# Patient Record
Sex: Male | Born: 1975 | Race: White | Hispanic: No | Marital: Married | Smoking: Never smoker
Health system: Southern US, Community
[De-identification: ages and names within clinical notes are randomized; demographics above are authoritative.]

## PROBLEM LIST (undated history)

## (undated) DIAGNOSIS — Z87442 Personal history of urinary calculi: Secondary | ICD-10-CM

---

## 1998-09-10 HISTORY — PX: KNEE ARTHROSCOPY W/ ACL RECONSTRUCTION: SHX1858

## 2011-10-01 ENCOUNTER — Emergency Department: Payer: Self-pay | Admitting: Emergency Medicine

## 2020-01-08 ENCOUNTER — Other Ambulatory Visit (HOSPITAL_COMMUNITY)
Admission: RE | Admit: 2020-01-08 | Discharge: 2020-01-08 | Disposition: A | Discharge status: Home / Self Care | Payer: BC Managed Care – PPO | Source: Ambulatory Visit | Attending: Urology | Admitting: Urology

## 2020-01-08 ENCOUNTER — Other Ambulatory Visit: Payer: Self-pay | Admitting: Urology

## 2020-01-08 ENCOUNTER — Encounter (HOSPITAL_BASED_OUTPATIENT_CLINIC_OR_DEPARTMENT_OTHER): Payer: Self-pay | Admitting: Urology

## 2020-01-08 DIAGNOSIS — Z20822 Contact with and (suspected) exposure to covid-19: Secondary | ICD-10-CM | POA: Insufficient documentation

## 2020-01-08 DIAGNOSIS — Z01812 Encounter for preprocedural laboratory examination: Secondary | ICD-10-CM | POA: Insufficient documentation

## 2020-01-08 LAB — SARS CORONAVIRUS 2 (TAT 6-24 HRS): SARS Coronavirus 2: NEGATIVE

## 2020-01-08 MED FILL — traMADol HCL 50 MG TABS: 50 | 2 days supply | Qty: 10 | Fill #0

## 2020-01-08 NOTE — H&P (Signed)
Office Visit Report     01/08/2020   --------------------------------------------------------------------------------   Alejandro Wright  MRN: [id number]  DOB: Jan 08, 1976, 44 year old Male  SSN:    PRIMARY CARE:    REFERRING:    PROVIDER:  Jerilee Field, M.D.  TREATING:  Berniece Salines, M.D.  LOCATION:  [facility] [phone]     --------------------------------------------------------------------------------   CC: Acute Kidney Stone  HPI: Alejandro Wright is a 44 year-old male patient who is here for further eval and management of kidney stones.  The patient presented to [facility] with symptoms of a kidney stone.   His pain started about 01/02/2020. The pain is on the right side.   The patient underwent KUB prior to today's appointment.   The patient relates initially having nausea, flank pain, voiding symptoms, and groin pain. He is currently having flank pain. He denies having back pain, groin pain, nausea, vomiting, fever, chills, and voiding symptoms. He has not caught a stone in his urine strainer since his symptoms began.   He has had ESWL for treatment of his stones in the past. This is not his first kidney stone. His first stone was approximately 12/10/2014. He has had more than 5 stones prior to getting this one.   Patient for started passed in stones as a teenager. He states that he passes 6 stones a year. He has E typically able to pass these on his own. He has passed very large stones in the past. He has had shockwave lithotripsy also for stones that are too big to pass.   The patient generally does not require any pain medication. He recently took Voltaren because of severe discomfort. He was seen by of primary care physician in [place] and told he had a very large stone.     ALLERGIES: None   MEDICATIONS: Cipro  Ultram 50 mg tablet 1-2 tablet PO Q 6 H  Advil  Diclofenac     GU PSH: Cysto Uretero Lithotripsy - about  2016     NON-GU PSH: None   GU PMH: None   NON-GU PMH: None   FAMILY HISTORY: Kidney Stones - Runs in Family   SOCIAL HISTORY: Marital Status: Married Preferred Language: English Current Smoking Status: Patient has never smoked.   Tobacco Use Assessment Completed: Used Tobacco in last 30 days? Does drink.  Drinks 1 caffeinated drink per day. 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    REVIEW OF SYSTEMS:    GU Review Male:   Patient reports get up at night to urinate. Patient denies frequent urination, hard to postpone urination, burning/ pain with urination, leakage of urine, stream starts and stops, trouble starting your stream, have to strain to urinate , erection problems, and penile pain.  Gastrointestinal (Upper):   Patient reports nausea. Patient denies vomiting and indigestion/ heartburn.  Gastrointestinal (Lower):   Patient denies diarrhea and constipation.  Constitutional:   Patient denies fever, night sweats, weight loss, and fatigue.  Skin:   Patient denies skin rash/ lesion and itching.  Eyes:   Patient denies blurred vision and double vision.  Ears/ Nose/ Throat:   Patient denies sore throat and sinus problems.  Hematologic/Lymphatic:   Patient denies swollen glands and easy bruising.  Cardiovascular:   Patient denies leg swelling and chest pains.  Respiratory:   Patient denies cough and shortness of breath.  Endocrine:   Patient denies excessive thirst.  Musculoskeletal:   Patient reports back pain. Patient denies joint pain.  Neurological:   Patient denies headaches and dizziness.  Psychologic:   Patient denies depression and anxiety.   Notes: Pt c/o blood in urine    VITAL SIGNS:      01/08/2020 01:35 PM  Weight 150 lb / 68.04 kg  Height 68 in / 172.72 cm  BP 120/74 mmHg  Heart Rate 56 /min  Temperature 98.0 F / 36.6 C  BMI 22.8 kg/m   MULTI-SYSTEM PHYSICAL EXAMINATION:    Constitutional: Well-nourished. No physical deformities. Normally developed.  Good grooming.  Neck: Neck symmetrical, not swollen. Normal tracheal position.  Respiratory: Normal breath sounds. No labored breathing, no use of accessory muscles.   Cardiovascular: Regular rate and rhythm. No murmur, no gallop. Normal temperature, normal extremity pulses, no swelling, no varicosities.   Lymphatic: No enlargement of neck, axillae, groin.  Skin: No paleness, no jaundice, no cyanosis. No lesion, no ulcer, no rash.  Neurologic / Psychiatric: Oriented to time, oriented to place, oriented to person. No depression, no anxiety, no agitation.  Gastrointestinal: No mass, no tenderness, no rigidity, non obese abdomen.  Eyes: Normal conjunctivae. Normal eyelids.  Ears, Nose, Mouth, and Throat: Left ear no scars, no lesions, no masses. Right ear no scars, no lesions, no masses. Nose no scars, no lesions, no masses. Normal hearing. Normal lips.  Musculoskeletal: Normal gait and station of head and neck.     Complexity of Data:  Records Review:   Previous Patient Records, POC Tool  Urine Test Review:   Urinalysis   PROCEDURES:         KUB - 32355  A single view of the abdomen is obtained. Renal shadows are easily visualized bilaterally.  The patient appears to have approximately 5 stones in the right kidney and 6 stones in the left kidney. These are all Approximately 5-10 mm There are no stones appreciated within the expected location in either renal pelvis.  There is a 9 x 9 mm stone in the right proximal ureter There are no additional calcifications along the expected location of either ureter bilaterally.  Gas pattern is grossly normal. No significant bony abnormalities.      Impression: Bilateral nonobstructing stones with a 9 x 9 mm stone in the right mid proximal ureter.           Urinalysis w/Scope Micro  WBC/hpf: 0 - 5/hpf  RBC/hpf: 3 - 10/hpf  Bacteria: NS (Not Seen)  Cystals: NS (Not Seen)  Casts: NS (Not Seen)  Trichomonas: Not Present  Mucous: Not Present   Epithelial Cells: NS (Not Seen)  Yeast: NS (Not Seen)  Sperm: Not Present    ASSESSMENT:      ICD-10 Details  1 GU:   Renal and ureteral calculus - N20.2    PLAN:            Medications Refill Meds: Ultram 50 mg tablet 1-2 tablet PO Q 6 H   #10  0 Refill(s)            Orders X-Rays: KUB          Schedule         Document Letter(s):  Created for Patient: Clinical Summary         Notes:   The patient has history of extensive stone disease. He is typically able to pass the stones on his own. However, he has a 9 x 9 mm stone in the right proximal ureter easily visualized on today's x-ray that he has been able to pass, and  in my opinion likely will pass.   I recommended treatment for the obstructing stone, the patient is opted for shockwave lithotripsy.   We discussed management options including medical expulsion therapy, shockwave lithotripsy, and ureteroscopy. Ultimately, the patient has opted for shock wave lithotripsy. I discussed with the patient the procedure in detail as well as the risk and benefits. The patient is aware that she may need additional procedures. She also is aware of the risks of hematoma and pain. We will try to get this patient's scheduled as soon as possible.   I will send in some tramadol for the patient.         Next Appointment:      Next Appointment: 01/11/2020 10:00 AM    Appointment Type: Surgery     Location: [facility] [phone]    Provider: Festus Aloe, M.D.    Reason for Visit: OP NE RT ESWL      * Signed by Louis Meckel, M.D. on 01/08/20 at 3:17 PM (EDT)*     The information contained in this medical record document is considered private and confidential patient information. This information can only be used for the medical diagnosis and/or medical services that are being provided by the patient's selected caregivers. This information can only be distributed outside of the patient's care if the patient  agrees and signs waivers of authorization for this information to be sent to an outside source or route.

## 2020-01-11 ENCOUNTER — Encounter (HOSPITAL_BASED_OUTPATIENT_CLINIC_OR_DEPARTMENT_OTHER): Payer: Self-pay | Admitting: Urology

## 2020-01-11 ENCOUNTER — Encounter (HOSPITAL_BASED_OUTPATIENT_CLINIC_OR_DEPARTMENT_OTHER)
Admission: RE | Disposition: A | Discharge status: Home / Self Care | Payer: Self-pay | Source: Home / Self Care | Attending: Urology

## 2020-01-11 ENCOUNTER — Other Ambulatory Visit: Payer: Self-pay

## 2020-01-11 ENCOUNTER — Ambulatory Visit (HOSPITAL_BASED_OUTPATIENT_CLINIC_OR_DEPARTMENT_OTHER)
Admission: RE | Admit: 2020-01-11 | Discharge: 2020-01-11 | Disposition: A | Discharge status: Home / Self Care | Payer: BC Managed Care – PPO | Attending: Urology | Admitting: Urology

## 2020-01-11 ENCOUNTER — Ambulatory Visit (HOSPITAL_COMMUNITY): Payer: BC Managed Care – PPO

## 2020-01-11 DIAGNOSIS — N202 Calculus of kidney with calculus of ureter: Secondary | ICD-10-CM | POA: Diagnosis not present

## 2020-01-11 DIAGNOSIS — N200 Calculus of kidney: Secondary | ICD-10-CM | POA: Diagnosis present

## 2020-01-11 DIAGNOSIS — Z87442 Personal history of urinary calculi: Secondary | ICD-10-CM | POA: Diagnosis not present

## 2020-01-11 DIAGNOSIS — N201 Calculus of ureter: Secondary | ICD-10-CM

## 2020-01-11 HISTORY — DX: Personal history of urinary calculi: Z87.442

## 2020-01-11 HISTORY — PX: EXTRACORPOREAL SHOCK WAVE LITHOTRIPSY: SHX1557

## 2020-01-11 SURGERY — LITHOTRIPSY, ESWL
Anesthesia: LOCAL | Laterality: Right

## 2020-01-11 MED ORDER — DIAZEPAM 5 MG PO TABS
ORAL_TABLET | ORAL | Status: AC
  Filled 2020-01-11: qty 2

## 2020-01-11 MED ORDER — DIAZEPAM 5 MG PO TABS
10.0000 mg | ORAL_TABLET | ORAL | Status: AC
  Administered 2020-01-11: 10 mg via ORAL

## 2020-01-11 MED ORDER — CIPROFLOXACIN HCL 500 MG PO TABS
ORAL_TABLET | ORAL | Status: AC
  Filled 2020-01-11: qty 1

## 2020-01-11 MED ORDER — DIPHENHYDRAMINE HCL 25 MG PO CAPS
25.0000 mg | ORAL_CAPSULE | ORAL | Status: AC
  Administered 2020-01-11: 25 mg via ORAL

## 2020-01-11 MED ORDER — TRAMADOL HCL 50 MG PO TABS: 50.0000 mg | ORAL_TABLET | Freq: Four times a day (QID) | ORAL | 0 refills | Status: AC | PRN

## 2020-01-11 MED ORDER — SODIUM CHLORIDE 0.9 % IV SOLN: INTRAVENOUS | Status: DC

## 2020-01-11 MED ORDER — TRAMADOL HCL 50 MG PO TABS
ORAL_TABLET | ORAL | Status: AC
  Filled 2020-01-11: qty 1

## 2020-01-11 MED ORDER — CIPROFLOXACIN HCL 500 MG PO TABS
500.0000 mg | ORAL_TABLET | ORAL | Status: AC
  Administered 2020-01-11: 500 mg via ORAL

## 2020-01-11 MED ORDER — DIPHENHYDRAMINE HCL 25 MG PO CAPS
ORAL_CAPSULE | ORAL | Status: AC
  Filled 2020-01-11: qty 1

## 2020-01-11 MED ORDER — TRAMADOL HCL 50 MG PO TABS
50.0000 mg | ORAL_TABLET | Freq: Once | ORAL | Status: AC
  Administered 2020-01-11: 50 mg via ORAL

## 2020-01-11 MED FILL — traMADol HCL 50 MG TABS: 50 | 2 days supply | Qty: 10 | Fill #0

## 2020-01-11 NOTE — Discharge Instructions (Signed)
Post Anesthesia Home Care Instructions  Activity: Get plenty of rest for the remainder of the day. A responsible adult should stay with you for 24 hours following the procedure.  For the next 24 hours, DO NOT: -Drive a car -Operate machinery -Drink alcoholic beverages -Take any medication unless instructed by your physician -Make any legal decisions or sign important papers.  Meals: Start with liquid foods such as gelatin or soup. Progress to regular foods as tolerated. Avoid greasy, spicy, heavy foods. If nausea and/or vomiting occur, drink only clear liquids until the nausea and/or vomiting subsides. Call your physician if vomiting continues.  Special Instructions/Symptoms: Your throat may feel dry or sore from the anesthesia or the breathing tube placed in your throat during surgery. If this causes discomfort, gargle with warm salt water. The discomfort should disappear within 24 hours.  If you had a scopolamine patch placed behind your ear for the management of post- operative nausea and/or vomiting:  1. The medication in the patch is effective for 72 hours, after which it should be removed.  Wrap patch in a tissue and discard in the trash. Wash hands thoroughly with soap and water. 2. You may remove the patch earlier than 72 hours if you experience unpleasant side effects which may include dry mouth, dizziness or visual disturbances. 3. Avoid touching the patch. Wash your hands with soap and water after contact with the patch.   Lithotripsy, Care After This sheet gives you information about how to care for yourself after your procedure. Your health care provider may also give you more specific instructions. If you have problems or questions, contact your health care provider. What can I expect after the procedure? After the procedure, it is common to have:  Some blood in your urine. This should only last for a few days.  Soreness in your back, sides, or upper abdomen for a few  days.  Blotches or bruises on your back where the pressure wave entered the skin.  Pain, discomfort, or nausea when pieces (fragments) of the kidney stone move through the tube that carries urine from the kidney to the bladder (ureter). Stone fragments may pass soon after the procedure, but they may continue to pass for up to 4-8 weeks. ? If you have severe pain or nausea, contact your health care provider. This may be caused by a large stone that was not broken up, and this may mean that you need more treatment.  Some pain or discomfort during urination.  Some pain or discomfort in the lower abdomen or (in men) at the base of the penis. Follow these instructions at home: Medicines  Take over-the-counter and prescription medicines only as told by your health care provider.  If you were prescribed an antibiotic medicine, take it as told by your health care provider. Do not stop taking the antibiotic even if you start to feel better.  Do not drive for 24 hours if you were given a medicine to help you relax (sedative).  Do not drive or use heavy machinery while taking prescription pain medicine. Eating and drinking      Drink enough water and fluids to keep your urine clear or pale yellow. This helps any remaining pieces of the stone to pass. It can also help prevent new stones from forming.  Eat plenty of fresh fruits and vegetables.  Follow instructions from your health care provider about eating and drinking restrictions. You may be instructed: ? To reduce how much salt (sodium) you eat   or drink. Check ingredients and nutrition facts on packaged foods and beverages. ? To reduce how much meat you eat.  Eat the recommended amount of calcium for your age and gender. Ask your health care provider how much calcium you should have. General instructions  Get plenty of rest.  Most people can resume normal activities 1-2 days after the procedure. Ask your health care provider what  activities are safe for you.  Your health care provider may direct you to lie in a certain position (postural drainage) and tap firmly (percuss) over your kidney area to help stone fragments pass. Follow instructions as told by your health care provider.  If directed, strain all urine through the strainer that was provided by your health care provider. ? Keep all fragments for your health care provider to see. Any stones that are found may be sent to a medical lab for examination. The stone may be as small as a grain of salt.  Keep all follow-up visits as told by your health care provider. This is important. Contact a health care provider if:  You have pain that is severe or does not get better with medicine.  You have nausea that is severe or does not go away.  You have blood in your urine longer than your health care provider told you to expect.  You have more blood in your urine.  You have pain during urination that does not go away.  You urinate more frequently than usual and this does not go away.  You develop a rash or any other possible signs of an allergic reaction. Get help right away if:  You have severe pain in your back, sides, or upper abdomen.  You have severe pain while urinating.  Your urine is very dark red.  You have blood in your stool (feces).  You cannot pass any urine at all.  You feel a strong urge to urinate after emptying your bladder.  You have a fever or chills.  You develop shortness of breath, difficulty breathing, or chest pain.  You have severe nausea that leads to persistent vomiting.  You faint. Summary  After this procedure, it is common to have some pain, discomfort, or nausea when pieces (fragments) of the kidney stone move through the tube that carries urine from the kidney to the bladder (ureter). If this pain or nausea is severe, however, you should contact your health care provider.  Most people can resume normal activities 1-2  days after the procedure. Ask your health care provider what activities are safe for you.  Drink enough water and fluids to keep your urine clear or pale yellow. This helps any remaining pieces of the stone to pass, and it can help prevent new stones from forming.  If directed, strain your urine and keep all fragments for your health care provider to see. Fragments or stones may be as small as a grain of salt.  Get help right away if you have severe pain in your back, sides, or upper abdomen or have severe pain while urinating. This information is not intended to replace advice given to you by your health care provider. Make sure you discuss any questions you have with your health care provider. Document Revised: 12/08/2018 Document Reviewed: 07/18/2016 Elsevier Patient Education  2020 [employer]  

## 2020-01-11 NOTE — Interval H&P Note (Signed)
History and Physical Interval Note:  01/11/2020 11:07 AM  Alejandro Wright  has presented today for surgery, with the diagnosis of RIGHT UREERAL STONE.  The various methods of treatment have been discussed with the patient and family. After consideration of risks, benefits and other options for treatment, the patient has consented to  Procedure(s): EXTRACORPOREAL SHOCK WAVE LITHOTRIPSY (ESWL) (Right) as a surgical intervention.  The patient's history has been reviewed, patient examined, no change in status, stable for surgery.  I have reviewed the patient's chart and labs. Pt without fever. No stone pass. Stone(s) right proximal ureter on KUB. Pt needs a few more tramadol. I checked PMP aware. Questions were answered to the patient's satisfaction.     Jerilee Field

## 2020-01-11 NOTE — Op Note (Signed)
Right 9 mm proximal stone  Right ESWL   Findings: stone was dense and slow to change. We slowed the rate to 60. It did fragment and we could see some small fragments slight fragments up the ureter. He may need a staged procedure.

## 2020-12-17 IMAGING — DX DG ABDOMEN 1V
1 series · 1 of 1 positions shown · non-contrast
Comparison: 01/08/2020

CLINICAL DATA: 43-year-old male with right ureteral stone

EXAM:
ABDOMEN - 1 VIEW

[abdomen kub]
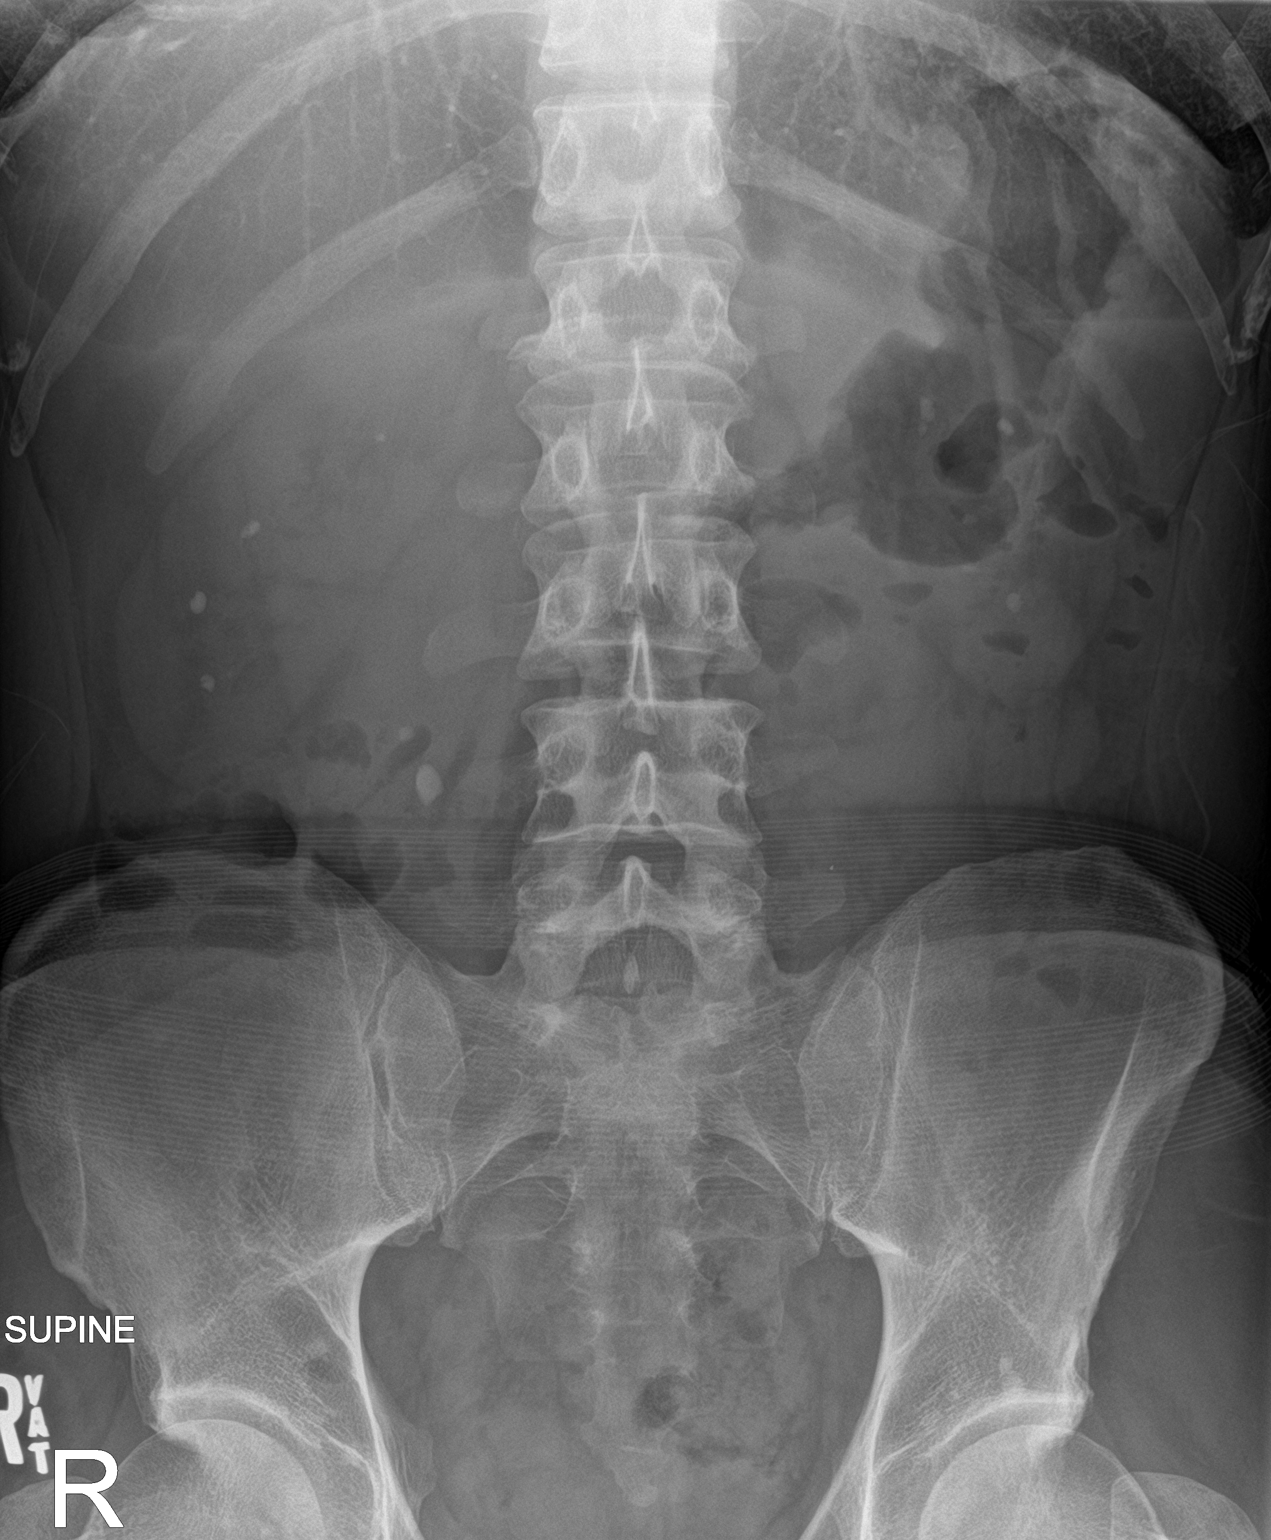

[1 of 1 positions shown; findings below may reference images not displayed]

FINDINGS: Gas within stomach small bowel and colon without abnormal
distention.

No unexpected radiopaque foreign body.

Four calcifications project in the region of the right renal
silhouette.

Four calcifications project in the region of the left renal
silhouette.

There are 2 geometric calcifications projecting over the right psoas
muscle, to the right of the L4 vertebral body. The larger is
estimated 10 mm.

The above appearance is essentially unchanged from the comparison
plain film.

No displaced fracture.
IMPRESSION: Bilateral nephrolithiasis as well as mid right ureteral stones,
essentially unchanged when compared to the prior plain film.
